# Patient Record
Sex: Male | Born: 2000 | Race: White | Hispanic: No | Marital: Single | Smoking: Never smoker
Health system: Southern US, Community
[De-identification: ages and names within clinical notes are randomized; demographics above are authoritative.]

---
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
## 2021-08-03 ENCOUNTER — Encounter (HOSPITAL_COMMUNITY): Payer: Self-pay | Admitting: Emergency Medicine

## 2021-08-03 ENCOUNTER — Emergency Department (HOSPITAL_COMMUNITY): Payer: BC Managed Care – PPO

## 2021-08-03 ENCOUNTER — Other Ambulatory Visit: Payer: Self-pay

## 2021-08-03 ENCOUNTER — Emergency Department (HOSPITAL_COMMUNITY)
Admission: EM | Admit: 2021-08-03 | Discharge: 2021-08-03 | Disposition: A | Discharge status: Home / Self Care | Payer: BC Managed Care – PPO | Attending: Emergency Medicine | Admitting: Emergency Medicine

## 2021-08-03 DIAGNOSIS — S02401A Maxillary fracture, unspecified, initial encounter for closed fracture: Secondary | ICD-10-CM

## 2021-08-03 DIAGNOSIS — Z20822 Contact with and (suspected) exposure to covid-19: Secondary | ICD-10-CM | POA: Diagnosis not present

## 2021-08-03 DIAGNOSIS — S0240CA Maxillary fracture, right side, initial encounter for closed fracture: Secondary | ICD-10-CM | POA: Insufficient documentation

## 2021-08-03 DIAGNOSIS — T07XXXA Unspecified multiple injuries, initial encounter: Secondary | ICD-10-CM

## 2021-08-03 DIAGNOSIS — Y908 Blood alcohol level of 240 mg/100 ml or more: Secondary | ICD-10-CM | POA: Diagnosis not present

## 2021-08-03 DIAGNOSIS — Y92410 Unspecified street and highway as the place of occurrence of the external cause: Secondary | ICD-10-CM | POA: Diagnosis not present

## 2021-08-03 DIAGNOSIS — Z23 Encounter for immunization: Secondary | ICD-10-CM | POA: Insufficient documentation

## 2021-08-03 DIAGNOSIS — S0993XA Unspecified injury of face, initial encounter: Secondary | ICD-10-CM | POA: Diagnosis present

## 2021-08-03 DIAGNOSIS — S01541A Puncture wound with foreign body of lip, initial encounter: Secondary | ICD-10-CM | POA: Insufficient documentation

## 2021-08-03 DIAGNOSIS — R000 Tachycardia, unspecified: Secondary | ICD-10-CM | POA: Insufficient documentation

## 2021-08-03 DIAGNOSIS — F10129 Alcohol abuse with intoxication, unspecified: Secondary | ICD-10-CM | POA: Insufficient documentation

## 2021-08-03 DIAGNOSIS — S0181XA Laceration without foreign body of other part of head, initial encounter: Secondary | ICD-10-CM

## 2021-08-03 DIAGNOSIS — F10920 Alcohol use, unspecified with intoxication, uncomplicated: Secondary | ICD-10-CM

## 2021-08-03 LAB — COMPREHENSIVE METABOLIC PANEL
ALT: 20 U/L (ref 0–44)
AST: 43 U/L — ABNORMAL HIGH (ref 15–41)
Albumin: 4.3 g/dL (ref 3.5–5.0)
Alkaline Phosphatase: 55 U/L (ref 38–126)
Anion gap: 12 (ref 5–15)
BUN: 13 mg/dL (ref 6–20)
CO2: 20 mmol/L — ABNORMAL LOW (ref 22–32)
Calcium: 8.2 mg/dL — ABNORMAL LOW (ref 8.9–10.3)
Chloride: 110 mmol/L (ref 98–111)
Creatinine, Ser: 1.12 mg/dL (ref 0.61–1.24)
GFR, Estimated: >60 mL/min (ref >60)
Glucose, Bld: 112 mg/dL — ABNORMAL HIGH (ref 70–99)
Potassium: 3.6 mmol/L (ref 3.5–5.1)
Sodium: 142 mmol/L (ref 135–145)
Total Bilirubin: 0.7 mg/dL (ref 0.3–1.2)
Total Protein: 6.9 g/dL (ref 6.5–8.1)

## 2021-08-03 LAB — URINALYSIS, ROUTINE W REFLEX MICROSCOPIC
Bilirubin Urine: NEGATIVE
Glucose, UA: NEGATIVE mg/dL
Hgb urine dipstick: NEGATIVE
Ketones, ur: NEGATIVE mg/dL
Leukocytes,Ua: NEGATIVE
Nitrite: NEGATIVE
Protein, ur: NEGATIVE mg/dL
Specific Gravity, Urine: 1.031 — ABNORMAL HIGH (ref 1.005–1.030)
pH: 6 (ref 5.0–8.0)

## 2021-08-03 LAB — CBC
HCT: 48.1 % (ref 39.0–52.0)
Hemoglobin: 17.2 g/dL — ABNORMAL HIGH (ref 13.0–17.0)
MCH: 31.8 pg (ref 26.0–34.0)
MCHC: 35.8 g/dL (ref 30.0–36.0)
MCV: 88.9 fL (ref 80.0–100.0)
Platelets: 196 10*3/uL (ref 150–400)
RBC: 5.41 MIL/uL (ref 4.22–5.81)
RDW: 12.2 % (ref 11.5–15.5)
WBC: 7.1 10*3/uL (ref 4.0–10.5)
nRBC: 0 % (ref 0.0–0.2)

## 2021-08-03 LAB — SAMPLE TO BLOOD BANK

## 2021-08-03 LAB — LACTIC ACID, PLASMA: Lactic Acid, Venous: 2.8 mmol/L (ref 0.5–1.9)

## 2021-08-03 LAB — RESP PANEL BY RT-PCR (FLU A&B, COVID) ARPGX2
Influenza A by PCR: NEGATIVE
Influenza B by PCR: NEGATIVE
SARS Coronavirus 2 by RT PCR: NEGATIVE

## 2021-08-03 LAB — PROTIME-INR
INR: 1.1 (ref 0.8–1.2)
Prothrombin Time: 14.5 seconds (ref 11.4–15.2)

## 2021-08-03 LAB — ETHANOL: Alcohol, Ethyl (B): 260 mg/dL — ABNORMAL HIGH (ref <10)

## 2021-08-03 MED ORDER — CEPHALEXIN 500 MG PO CAPS: 500.0000 mg | ORAL_CAPSULE | Freq: Four times a day (QID) | ORAL | 0 refills | Status: AC

## 2021-08-03 MED ORDER — TETANUS-DIPHTH-ACELL PERTUSSIS 5-2.5-18.5 LF-MCG/0.5 IM SUSY
0.5000 mL | PREFILLED_SYRINGE | Freq: Once | INTRAMUSCULAR | Status: AC
  Administered 2021-08-03: 0.5 mL via INTRAMUSCULAR
  Filled 2021-08-03: qty 0.5

## 2021-08-03 MED ORDER — DICLOFENAC SODIUM ER 100 MG PO TB24: 100.0000 mg | ORAL_TABLET | Freq: Every day | ORAL | 0 refills | Status: AC

## 2021-08-03 MED ORDER — CEFAZOLIN SODIUM-DEXTROSE 2-4 GM/100ML-% IV SOLN
2.0000 g | Freq: Once | INTRAVENOUS | Status: AC
  Administered 2021-08-03: 2 g via INTRAVENOUS
  Filled 2021-08-03: qty 100

## 2021-08-03 MED ORDER — IOHEXOL 300 MG/ML  SOLN
100.0000 mL | Freq: Once | INTRAMUSCULAR | Status: AC | PRN
  Administered 2021-08-03: 100 mL via INTRAVENOUS

## 2021-08-03 MED ORDER — ONDANSETRON HCL 4 MG/2ML IJ SOLN
INTRAMUSCULAR | Status: AC
  Administered 2021-08-03: 4 mg via INTRAVENOUS
  Filled 2021-08-03: qty 2

## 2021-08-03 MED ORDER — ONDANSETRON HCL 4 MG/2ML IJ SOLN: 4.0000 mg | Freq: Once | INTRAMUSCULAR | Status: AC

## 2021-08-03 MED ORDER — MUPIROCIN CALCIUM 2 % EX CREA: 1.0000 | TOPICAL_CREAM | Freq: Two times a day (BID) | CUTANEOUS | 0 refills | Status: AC

## 2021-08-03 MED ORDER — SODIUM CHLORIDE 0.9 % IV BOLUS
1000.0000 mL | Freq: Once | INTRAVENOUS | Status: AC
  Administered 2021-08-03: 1000 mL via INTRAVENOUS

## 2021-08-03 MED ORDER — LIDOCAINE-EPINEPHRINE (PF) 2 %-1:200000 IJ SOLN
10.0000 mL | Freq: Once | INTRAMUSCULAR | Status: DC
  Filled 2021-08-03: qty 20
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
## 2021-08-03 NOTE — ED Notes (Signed)
Pt ambulated in the room with a steady gait.  

## 2021-08-03 NOTE — ED Provider Notes (Signed)
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 mLs Intravenous Contrast Given 08/03/21 0445)  ondansetron (ZOFRAN) injection 4 mg (4 mg Intravenous Given 08/03/21 0455)    ED Course/ Medical Decision Making/ A&P                           Medical Decision Making [facility]   Amount and/or Complexity of Data Reviewed External Data Reviewed: notes.    Details: previous notes reviewed Labs: ordered.    Details: all labs reivewed: alcohol elevated 260, normal sodium 3.6, normal creatinine 1.12, normal ALT, normal white 7.1 elevated hemoglobin 17.2 normal platelets 196K Radiology: ordered and independent interpretation performed.    Details: negative head CT and cervical spine,  Normal Chest abdomen and pelvis  Risk Prescription drug management. Risk Details: Foreign body removed by me     Final Clinical Impression(s) / ED Diagnoses Final diagnoses:  Alcoholic intoxication without complication ([place])  Abrasions of multiple sites  Closed fracture of maxillary sinus, initial encounter ([place])   Signed out to Dr. Ronnald Nian pending reassessment  Rx / DC Orders ED Discharge Orders          Ordered    cephALEXin (KEFLEX) 500 MG capsule  4 times daily         08/03/21 0546    mupirocin cream (BACTROBAN) 2 %  2 times daily        08/03/21 0546    Diclofenac Sodium CR 100 MG 24 hr tablet  Daily        08/03/21 0609              Frimy Uffelman, MD 08/03/21 [phone]
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
## 2021-08-20 ENCOUNTER — Ambulatory Visit
Admission: RE | Admit: 2021-08-20 | Discharge: 2021-08-20 | Disposition: A | Discharge status: Home / Self Care | Payer: 59 | Source: Ambulatory Visit | Attending: Nurse Practitioner | Admitting: Nurse Practitioner

## 2021-08-20 VITALS — BP 141/87 | HR 86 | Temp 99.0°F | Resp 18

## 2021-08-20 DIAGNOSIS — R21 Rash and other nonspecific skin eruption: Secondary | ICD-10-CM | POA: Diagnosis not present

## 2021-08-20 DIAGNOSIS — J029 Acute pharyngitis, unspecified: Secondary | ICD-10-CM | POA: Diagnosis present

## 2021-08-20 LAB — POCT RAPID STREP A (OFFICE): Rapid Strep A Screen: NEGATIVE

## 2021-08-20 MED ORDER — PREDNISONE 20 MG PO TABS: 40.0000 mg | ORAL_TABLET | Freq: Every day | ORAL | 0 refills | Status: AC
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
## 2021-08-20 NOTE — Discharge Instructions (Addendum)
Rapid strep test is negative, throat culture is pending.  Take medication as prescribed. Increase fluids and allow for plenty of rest. Recommend Tylenol as needed for pain, fever, or general discomfort. Recommend throat lozenges, Chloraseptic or honey to help with throat pain. Warm salt water gargles 3-4 times daily to help with throat pain or discomfort. Recommend a diet with soft foods to include soups, broths, puddings, yogurt, Jell-O's, or popsicles until symptoms improve.  May also take over-the-counter Zyrtec to help with itching during the daytime and Benadryl at bedtime. Avoid hot baths or showers while symptoms persist.  Recommend taking lukewarm baths. May apply cool cloths to the area to help with itching or discomfort. Avoid scratching, rubbing, or manipulating the areas while symptoms persist. Recommend Aveeno colloidal oatmeal bath to use to help with drying and itching. Follow-up if symptoms do not improve.

## 2021-08-20 NOTE — ED Provider Notes (Signed)
[facility]    CSN: [id number] Arrival date & time: 08/20/21  1643      History   Chief Complaint Chief Complaint  Patient presents with   Sore Throat    Entered by patient    HPI Victor Austin is a 21 y.o. male.   The history is provided by the patient.   Presents for complaints of sore throat and rash.  Patient states rash started after he started taking Keflex approximately 4 days ago.  Patient has since stopped taking the Keflex..  Patient states he started Benadryl and the rash has seemed to improve.  He states the rash was all over his body.  He states the rash was also more noticeable after getting out of the shower.  He denies fever, chills, itching, oozing, drainage, nausea, vomiting, or diarrhea.  He also complains of throat pain that started approximately 2 days ago.  He denies fever, chills, ear pain, headache, nasal congestion, runny nose, cough, abdominal pain, nausea, vomiting, or diarrhea.  Patient states that it is painful for him to eat.  He states that he took Tylenol which helped his throat pain.  He denies any known sick contacts.  He has not concern for COVID.  History reviewed. No pertinent past medical history.  There are no problems to display for this patient.   History reviewed. No pertinent surgical history.     Home Medications    Prior to Admission medications   Medication Sig Start Date End Date Taking? Authorizing Provider  predniSONE (DELTASONE) 20 MG tablet Take 2 tablets (40 mg total) by mouth daily with breakfast for 5 days. 08/20/21 08/25/21 Yes Osmani Kersten-Warren, Sadie Haber, NP  cephALEXin (KEFLEX) 500 MG capsule Take 1 capsule (500 mg total) by mouth 4 (four) times daily. 08/03/21   Palumbo, April, MD  Diclofenac Sodium CR 100 MG 24 hr tablet Take 1 tablet (100 mg total) by mouth daily. 08/03/21   Palumbo, April, MD  Lactobacillus Rhamnosus, GG, (CULTURELLE) CAPS 1 capsule 3 times daily for 5 days for diarrhea (may substitute  alternate probiotic) 12/20/15   Deis, Asher Muir, MD  mupirocin cream (BACTROBAN) 2 % Apply 1 Application topically 2 (two) times daily. 08/03/21   Palumbo, April, MD  ondansetron (ZOFRAN ODT) 4 MG disintegrating tablet Take 1 tablet (4 mg total) by mouth every 8 (eight) hours as needed for nausea or vomiting. 12/20/15   Ree Shay, MD    Family History History reviewed. No pertinent family history.  Social History Social History   Tobacco Use   Smoking status: Never   Smokeless tobacco: Never  Vaping Use   Vaping Use: Every day   Substances: Nicotine  Substance Use Topics   Alcohol use: Yes   Drug use: Not Currently     Allergies   Patient has no known allergies.   Review of Systems Review of Systems Per HPI  Physical Exam Triage Vital Signs ED Triage Vitals  Enc Vitals Group     BP 08/20/21 1658 (!) 141/87     Pulse Rate 08/20/21 1658 86     Resp 08/20/21 1658 18     Temp 08/20/21 1658 99 F (37.2 C)     Temp Source 08/20/21 1658 Oral     SpO2 08/20/21 1658 97 %     Weight --      Height --      Head Circumference --      Peak Flow --      Pain Score  08/20/21 1701 7     Pain Loc --      Pain Edu? --      Excl. in GC? --    No data found.  Updated Vital Signs BP (!) 141/87 (BP Location: Right Arm)   Pulse 86   Temp 99 F (37.2 C) (Oral)   Resp 18   SpO2 97%   Visual Acuity Right Eye Distance:   Left Eye Distance:   Bilateral Distance:    Right Eye Near:   Left Eye Near:    Bilateral Near:     Physical Exam Vitals and nursing note reviewed.  Constitutional:      General: He is not in acute distress.    Appearance: Normal appearance. He is well-developed.  HENT:     Head: Normocephalic.     Right Ear: Tympanic membrane, ear canal and external ear normal.     Left Ear: Tympanic membrane, ear canal and external ear normal.     Nose: Nose normal. No congestion or rhinorrhea.     Mouth/Throat:     Mouth: Mucous membranes are moist.     Pharynx:  Uvula midline. Pharyngeal swelling and posterior oropharyngeal erythema present. No oropharyngeal exudate or uvula swelling.     Tonsils: No tonsillar exudate. 1+ on the right. 1+ on the left.  Eyes:     Extraocular Movements: Extraocular movements intact.     Conjunctiva/sclera: Conjunctivae normal.     Pupils: Pupils are equal, round, and reactive to light.  Cardiovascular:     Rate and Rhythm: Normal rate and regular rhythm.     Pulses: Normal pulses.     Heart sounds: Normal heart sounds.  Pulmonary:     Effort: Pulmonary effort is normal. No respiratory distress.     Breath sounds: Normal breath sounds. No stridor. No wheezing, rhonchi or rales.  Abdominal:     General: Bowel sounds are normal.     Palpations: Abdomen is soft.     Tenderness: There is no abdominal tenderness.  Musculoskeletal:     Cervical back: Normal range of motion.  Lymphadenopathy:     Cervical: No cervical adenopathy.  Skin:    General: Skin is warm and dry.     Capillary Refill: Capillary refill takes less than 2 seconds.     Findings: Rash present. Rash is macular and papular.     Comments: Fine maculopapular rash located to the upper extremities and lower extremities.  Rash is flat, no sandpaper appearance.  Neurological:     General: No focal deficit present.     Mental Status: He is alert and oriented to person, place, and time.  Psychiatric:        Mood and Affect: Mood normal.        Behavior: Behavior normal.        Thought Content: Thought content normal.        Judgment: Judgment normal.      UC Treatments / Results  Labs (all labs ordered are listed, but only abnormal results are displayed) Labs Reviewed  CULTURE, GROUP A STREP [facility])  POCT RAPID STREP A (OFFICE)    EKG   Radiology No results found.  Procedures Procedures (including critical care time)  Medications Ordered in UC Medications - No data to display  Initial Impression / Assessment and Plan / UC Course  I  have reviewed the triage vital signs and the nursing notes.  Pertinent labs & imaging results that were available during my  care of the patient were reviewed by me and considered in my medical decision making (see chart for details).  Patient presents for complaints of rash and sore throat.  Patient states rash is slowly improving with the use of Benadryl.  On exam, patient's vital signs are stable, he is in no acute distress.  Patient does have a fine maculopapular rash located to the upper extremities and lower extremities; however, the rash does not have any sandpaper feel or appearance.  He has +1 tonsil swelling, no exudate is present.  We will treat patient symptomatically for his rash with prednisone.  Supportive care recommendations were provided with regard to his throat symptoms.  Throat culture is pending at this time.  Patient was advised that if the results of the culture are positive, he will be contacted to discuss treatment.  Patient advised to follow-up if his symptoms fail to improve. Final Clinical Impressions(s) / UC Diagnoses   Final diagnoses:  Acute pharyngitis, unspecified etiology  Rash and nonspecific skin eruption     Discharge Instructions      Rapid strep test is negative, throat culture is pending.  Take medication as prescribed. Increase fluids and allow for plenty of rest. Recommend Tylenol as needed for pain, fever, or general discomfort. Recommend throat lozenges, Chloraseptic or honey to help with throat pain. Warm salt water gargles 3-4 times daily to help with throat pain or discomfort. Recommend a diet with soft foods to include soups, broths, puddings, yogurt, Jell-O's, or popsicles until symptoms improve.  May also take over-the-counter Zyrtec to help with itching during the daytime and Benadryl at bedtime. Avoid hot baths or showers while symptoms persist.  Recommend taking lukewarm baths. May apply cool cloths to the area to help with itching or  discomfort. Avoid scratching, rubbing, or manipulating the areas while symptoms persist. Recommend Aveeno colloidal oatmeal bath to use to help with drying and itching. Follow-up if symptoms do not improve.       ED Prescriptions     Medication Sig Dispense Auth. Provider   predniSONE (DELTASONE) 20 MG tablet Take 2 tablets (40 mg total) by mouth daily with breakfast for 5 days. 10 tablet Akhil Piscopo-Warren, Sadie Haber, NP      PDMP not reviewed this encounter.   Abran Cantor, NP 08/20/21 1941

## 2021-08-23 LAB — CULTURE, GROUP A STREP (THRC)

## 2022-12-23 ENCOUNTER — Ambulatory Visit (INDEPENDENT_AMBULATORY_CARE_PROVIDER_SITE_OTHER): Payer: 59

## 2022-12-23 ENCOUNTER — Telehealth: Payer: Self-pay | Admitting: Nurse Practitioner

## 2022-12-23 ENCOUNTER — Ambulatory Visit
Admission: EM | Admit: 2022-12-23 | Discharge: 2022-12-23 | Disposition: A | Discharge status: Home / Self Care | Payer: 59 | Attending: Nurse Practitioner | Admitting: Nurse Practitioner

## 2022-12-23 DIAGNOSIS — J22 Unspecified acute lower respiratory infection: Secondary | ICD-10-CM

## 2022-12-23 DIAGNOSIS — R059 Cough, unspecified: Secondary | ICD-10-CM

## 2022-12-23 LAB — POC COVID19/FLU A&B COMBO
Covid Antigen, POC: NEGATIVE
Influenza A Antigen, POC: NEGATIVE
Influenza B Antigen, POC: NEGATIVE

## 2022-12-23 MED ORDER — PROMETHAZINE-DM 6.25-15 MG/5ML PO SYRP
5.0000 mL | ORAL_SOLUTION | Freq: Four times a day (QID) | ORAL | 0 refills | Status: AC | PRN
Start: 2022-12-23 — End: ?

## 2022-12-23 MED ORDER — AMOXICILLIN-POT CLAVULANATE 875-125 MG PO TABS: 1.0000 | ORAL_TABLET | Freq: Two times a day (BID) | ORAL | 0 refills | Status: AC

## 2022-12-23 MED ORDER — ACETAMINOPHEN 325 MG PO TABS: 975.0000 mg | ORAL_TABLET | Freq: Once | ORAL | Status: DC

## 2022-12-23 NOTE — ED Provider Notes (Signed)
[facility]    CSN: [id number] Arrival date & time: 12/23/22  1540      History   Chief Complaint Chief Complaint  Patient presents with   Fever   Cough   Chills    HPI Victor Austin is a 22 y.o. male.    Fever Associated symptoms: cough   Cough Associated symptoms: fever     History reviewed. No pertinent past medical history.  There are no active problems to display for this patient.   History reviewed. No pertinent surgical history.     Home Medications    Prior to Admission medications   Medication Sig Start Date End Date Taking? Authorizing Provider  cephALEXin (KEFLEX) 500 MG capsule Take 1 capsule (500 mg total) by mouth 4 (four) times daily. 08/03/21   Palumbo, April, MD  Diclofenac Sodium CR 100 MG 24 hr tablet Take 1 tablet (100 mg total) by mouth daily. 08/03/21   Palumbo, April, MD  Lactobacillus Rhamnosus, GG, (CULTURELLE) CAPS 1 capsule 3 times daily for 5 days for diarrhea (may substitute alternate probiotic) 12/20/15   Deis, Asher Muir, MD  mupirocin cream (BACTROBAN) 2 % Apply 1 Application topically 2 (two) times daily. 08/03/21   Palumbo, April, MD  ondansetron (ZOFRAN ODT) 4 MG disintegrating tablet Take 1 tablet (4 mg total) by mouth every 8 (eight) hours as needed for nausea or vomiting. 12/20/15   Ree Shay, MD    Family History History reviewed. No pertinent family history.  Social History Social History   Tobacco Use   Smoking status: Never   Smokeless tobacco: Never  Vaping Use   Vaping status: Every Day   Substances: Nicotine  Substance Use Topics   Alcohol use: Yes   Drug use: Not Currently     Allergies   Patient has no known allergies.   Review of Systems Review of Systems  Constitutional:  Positive for fever.  Respiratory:  Positive for cough.      Physical Exam Triage Vital Signs ED Triage Vitals [12/23/22 1621]  Encounter Vitals Group     BP 131/81     Systolic BP Percentile      Diastolic BP  Percentile      Pulse Rate (!) 102     Resp 18     Temp (!) 101.5 F (38.6 C)     Temp Source Oral     SpO2 96 %     Weight      Height      Head Circumference      Peak Flow      Pain Score      Pain Loc      Pain Education      Exclude from Growth Chart    No data found.  Updated Vital Signs BP 131/81 (BP Location: Left Arm)   Pulse (!) 102   Temp (!) 101.5 F (38.6 C) (Oral)   Resp 18   SpO2 96%   Visual Acuity Right Eye Distance:   Left Eye Distance:   Bilateral Distance:    Right Eye Near:   Left Eye Near:    Bilateral Near:     Physical Exam Vitals and nursing note reviewed.  Constitutional:      General: He is not in acute distress.    Appearance: Normal appearance.  HENT:     Head: Normocephalic.     Right Ear: Tympanic membrane, ear canal and external ear normal.     Left Ear: Tympanic membrane,  ear canal and external ear normal.     Nose: Congestion present.     Mouth/Throat:     Mouth: Mucous membranes are moist.  Eyes:     Extraocular Movements: Extraocular movements intact.     Conjunctiva/sclera: Conjunctivae normal.     Pupils: Pupils are equal, round, and reactive to light.  Cardiovascular:     Rate and Rhythm: Regular rhythm. Tachycardia present.     Pulses: Normal pulses.     Heart sounds: Normal heart sounds.  Pulmonary:     Effort: Pulmonary effort is normal. No respiratory distress.     Breath sounds: No stridor. Examination of the right-upper field reveals rales. Rales present. No wheezing or rhonchi.  Chest:     Chest wall: No tenderness.  Abdominal:     General: Bowel sounds are normal.     Palpations: Abdomen is soft.     Tenderness: There is no abdominal tenderness.  Musculoskeletal:     Cervical back: Normal range of motion.  Lymphadenopathy:     Cervical: No cervical adenopathy.  Skin:    General: Skin is warm and dry.  Neurological:     General: No focal deficit present.     Mental Status: He is alert and oriented to  person, place, and time.  Psychiatric:        Mood and Affect: Mood normal.        Behavior: Behavior normal.      UC Treatments / Results  Labs (all labs ordered are listed, but only abnormal results are displayed) Labs Reviewed - No data to display  EKG   Radiology No results found.  Procedures Procedures (including critical care time)  Medications Ordered in UC Medications  acetaminophen (TYLENOL) tablet 975 mg (has no administration in time range)    Initial Impression / Assessment and Plan / UC Course  I have reviewed the triage vital signs and the nursing notes.  Pertinent labs & imaging results that were available during my care of the patient were reviewed by me and considered in my medical decision making (see chart for details).  COVID/influenza test is negative.  On exam, crackles in the posterior right upper lobe were auscultated.  Chest x-ray is pending.  Patient was febrile during visit today; attempted to administer Tylenol 975 mg, but patient declined medication.  In the interim, will treat patient for possible pneumonia with Augmentin 875/125 mg tablets.  For cough, Promethazine DM was prescribed.  Supportive care recommendations were provided and discussed with the patient to include fluids, rest, normal saline nasal spray, and use of a humidifier during sleep.  Patient was given follow-up precautions.  Patient was in agreement with this plan of care and verbalized understanding.  All questions were answered.  Patient stable for discharge.  Final Clinical Impressions(s) / UC Diagnoses   Final diagnoses:  None   Discharge Instructions   None    ED Prescriptions   None    PDMP not reviewed this encounter.   Abran Cantor, NP 12/23/22 1705

## 2022-12-23 NOTE — Discharge Instructions (Addendum)
Chest x-ray is pending.  You will be contacted if the result is abnormal.  You also have access to the results via MyChart. I am treating you preemptively for possible pneumonia.  Take medication as prescribed. Increase fluids and allow for plenty of rest. Recommend using a humidifier in your bedroom at nighttime during sleep and sleeping slightly elevated on pillows while cough symptoms persist. Take Tylenol or ibuprofen as needed for pain, fever, or general discomfort. Monitor your symptoms for worsening.  If you experience new symptoms of shortness of breath, difficulty breathing, or other concerns while taking the medication, please go to the emergency department immediately for further evaluation. Follow-up as needed.

## 2022-12-23 NOTE — ED Triage Notes (Signed)
Pt presents to the office for fever, cough and nasal congestion. Fever started today at 100. Patient has been taking Nyquil.

## 2022-12-23 NOTE — Telephone Encounter (Signed)
Called patient to discuss chest x-ray results.  Verified patient using 2 patient identifiers.  Patient advised that there was no pneumonia on his x-ray, x-ray did show bronchitic changes.  Will continue with current plan of care.  Patient verbalized understanding.  All questions were answered.
